# Patient Record
Sex: Female | Born: 1980 | Race: White | Hispanic: No | Marital: Married | Smoking: Never smoker
Health system: Southern US, Community
[De-identification: ages and names within clinical notes are randomized; demographics above are authoritative.]

## PROBLEM LIST (undated history)

## (undated) DIAGNOSIS — R569 Unspecified convulsions: Secondary | ICD-10-CM

## (undated) HISTORY — PX: WISDOM TOOTH EXTRACTION: SHX21

## (undated) HISTORY — DX: Unspecified convulsions: R56.9

---
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
## 2003-09-24 ENCOUNTER — Emergency Department (HOSPITAL_COMMUNITY): Admission: EM | Admit: 2003-09-24 | Discharge: 2003-09-25 | Payer: Self-pay | Admitting: Emergency Medicine

## 2013-07-03 ENCOUNTER — Other Ambulatory Visit (HOSPITAL_COMMUNITY): Payer: Self-pay | Admitting: *Deleted

## 2013-07-03 DIAGNOSIS — G40909 Epilepsy, unspecified, not intractable, without status epilepticus: Secondary | ICD-10-CM

## 2013-07-03 DIAGNOSIS — O99350 Diseases of the nervous system complicating pregnancy, unspecified trimester: Secondary | ICD-10-CM

## 2013-07-03 DIAGNOSIS — IMO0002 Reserved for concepts with insufficient information to code with codable children: Secondary | ICD-10-CM

## 2013-07-03 DIAGNOSIS — Z0489 Encounter for examination and observation for other specified reasons: Secondary | ICD-10-CM

## 2013-08-14 ENCOUNTER — Other Ambulatory Visit (HOSPITAL_COMMUNITY): Payer: Self-pay | Admitting: Advanced Practice Midwife

## 2013-08-14 DIAGNOSIS — G40909 Epilepsy, unspecified, not intractable, without status epilepticus: Secondary | ICD-10-CM

## 2013-08-14 DIAGNOSIS — O99350 Diseases of the nervous system complicating pregnancy, unspecified trimester: Secondary | ICD-10-CM

## 2013-08-14 DIAGNOSIS — IMO0002 Reserved for concepts with insufficient information to code with codable children: Secondary | ICD-10-CM

## 2013-08-14 DIAGNOSIS — Z0489 Encounter for examination and observation for other specified reasons: Secondary | ICD-10-CM

## 2013-08-15 ENCOUNTER — Encounter (HOSPITAL_COMMUNITY): Payer: Self-pay

## 2013-08-15 ENCOUNTER — Ambulatory Visit (HOSPITAL_COMMUNITY)
Admission: RE | Admit: 2013-08-15 | Discharge: 2013-08-15 | Disposition: A | Discharge status: Home / Self Care | Payer: 59 | Source: Ambulatory Visit | Attending: Advanced Practice Midwife | Admitting: Advanced Practice Midwife

## 2013-08-15 DIAGNOSIS — Z0489 Encounter for examination and observation for other specified reasons: Secondary | ICD-10-CM

## 2013-08-15 DIAGNOSIS — O99350 Diseases of the nervous system complicating pregnancy, unspecified trimester: Principal | ICD-10-CM

## 2013-08-15 DIAGNOSIS — Z363 Encounter for antenatal screening for malformations: Secondary | ICD-10-CM | POA: Insufficient documentation

## 2013-08-15 DIAGNOSIS — IMO0002 Reserved for concepts with insufficient information to code with codable children: Secondary | ICD-10-CM

## 2013-08-15 DIAGNOSIS — Z1389 Encounter for screening for other disorder: Secondary | ICD-10-CM | POA: Insufficient documentation

## 2013-08-15 DIAGNOSIS — G40909 Epilepsy, unspecified, not intractable, without status epilepticus: Secondary | ICD-10-CM | POA: Insufficient documentation

## 2013-08-15 DIAGNOSIS — Z87891 Personal history of nicotine dependence: Secondary | ICD-10-CM | POA: Insufficient documentation

## 2013-08-15 IMAGING — US US OB DETAIL+14 WK
1 series · 12 of 28 positions shown · non-contrast
Comparison: none

[Series 1: us ob detail+14 wk · 0.15mm/px · 12 of 79 slices shown]
[im 3/79]
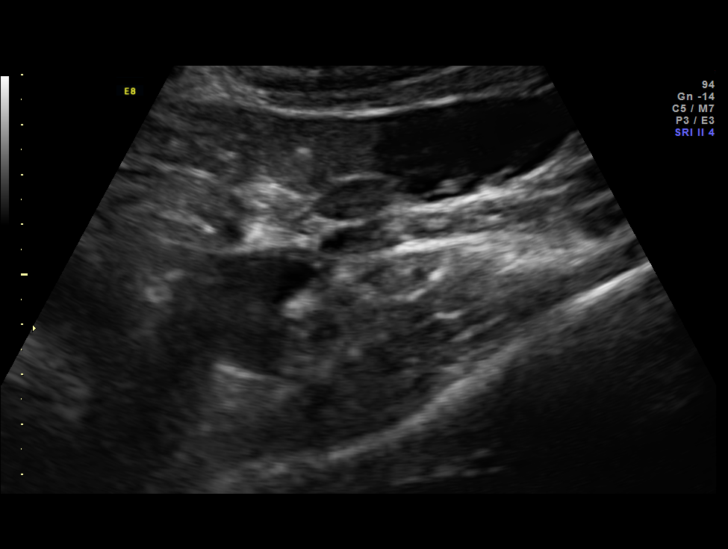
[im 9/79]
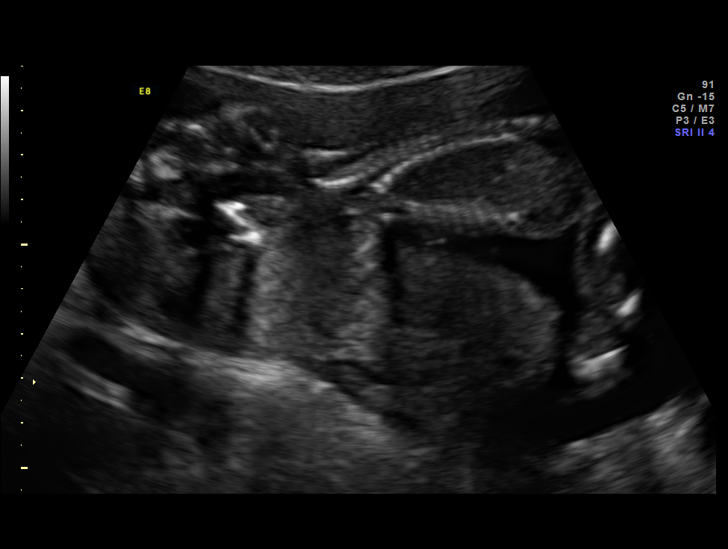
[im 15/79]
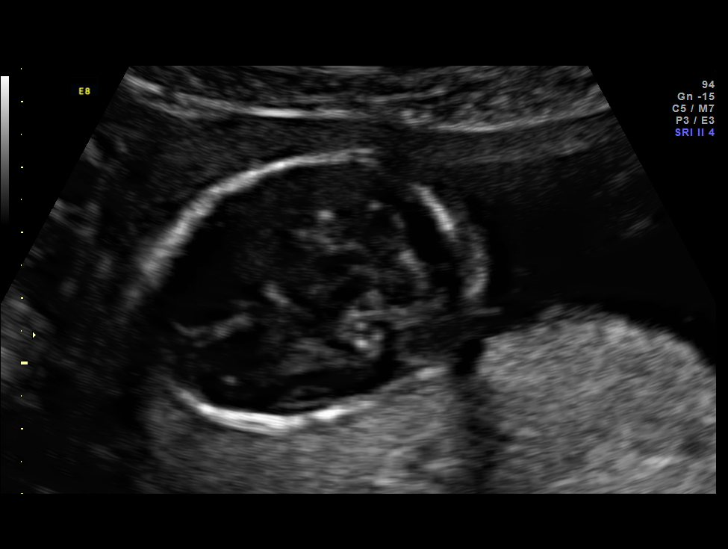
[im 24/79]
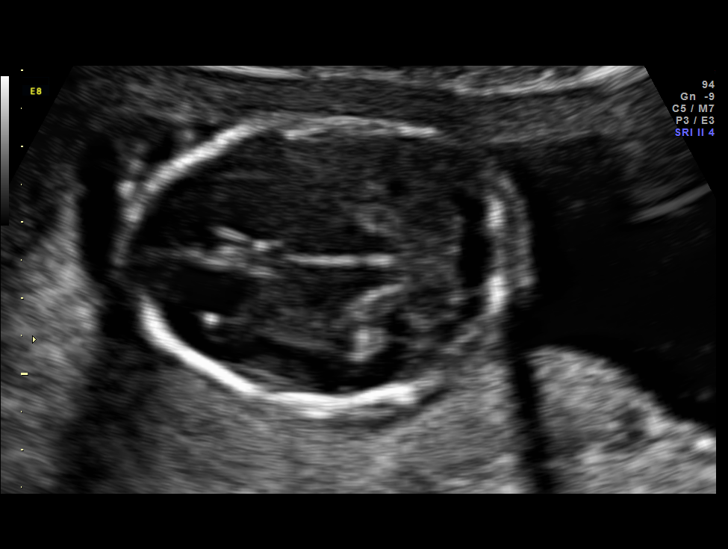
[im 29/79]
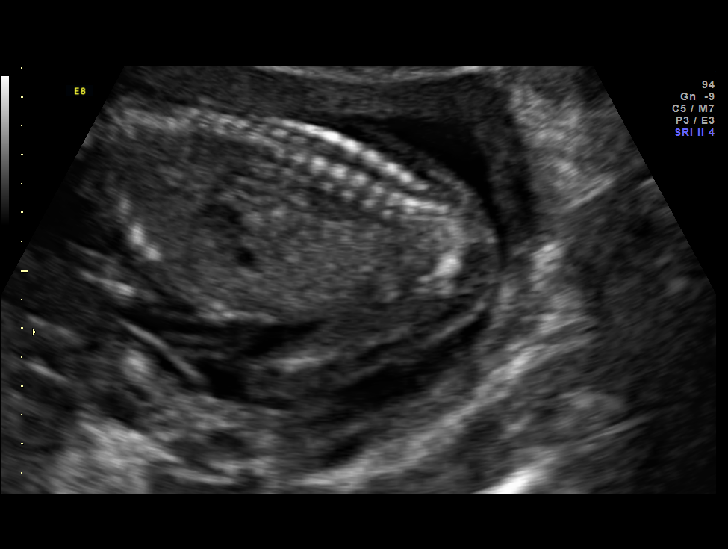
[im 35/79]
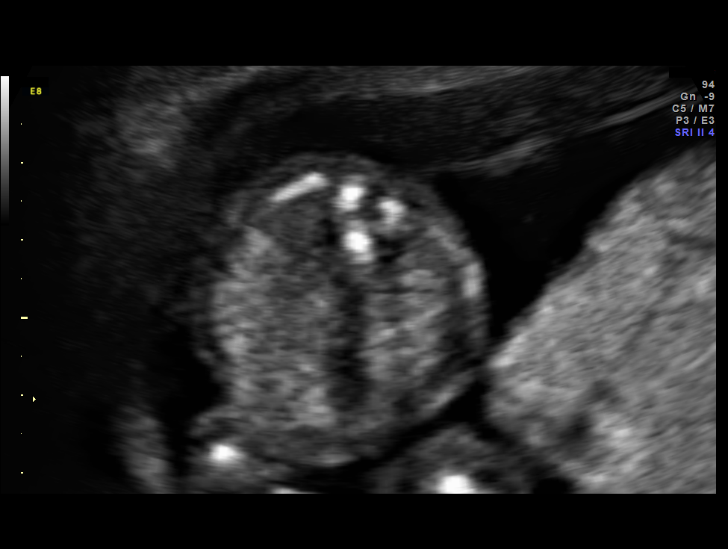
[im 44/79]
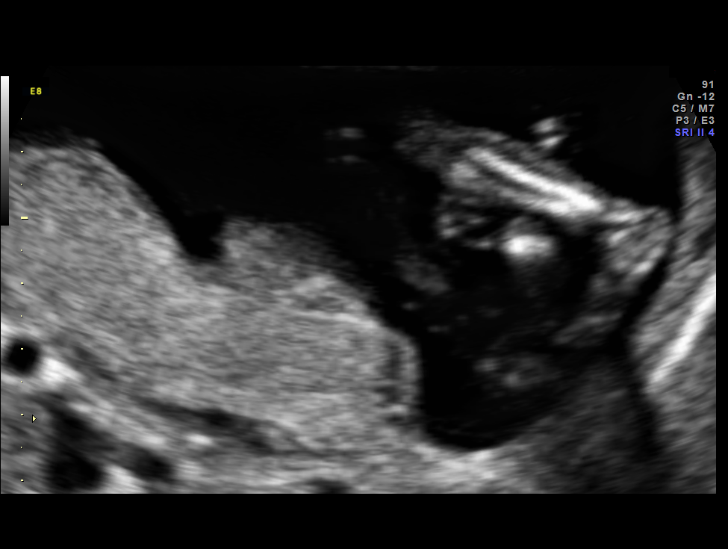
[im 50/79]
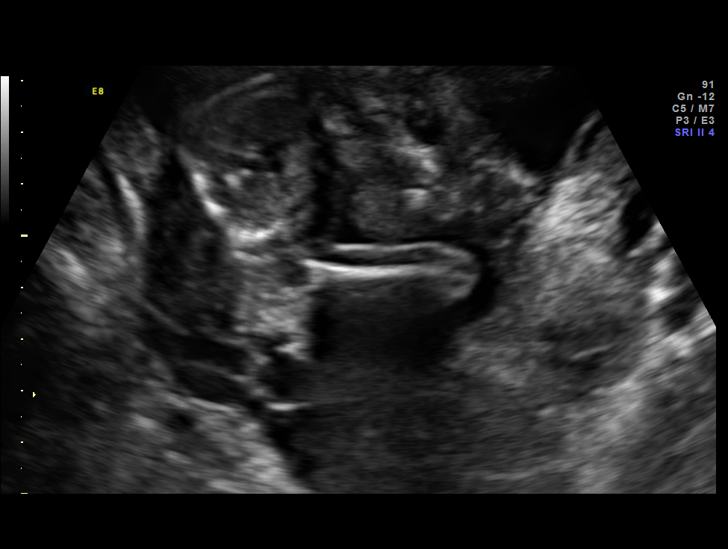
[im 55/79]
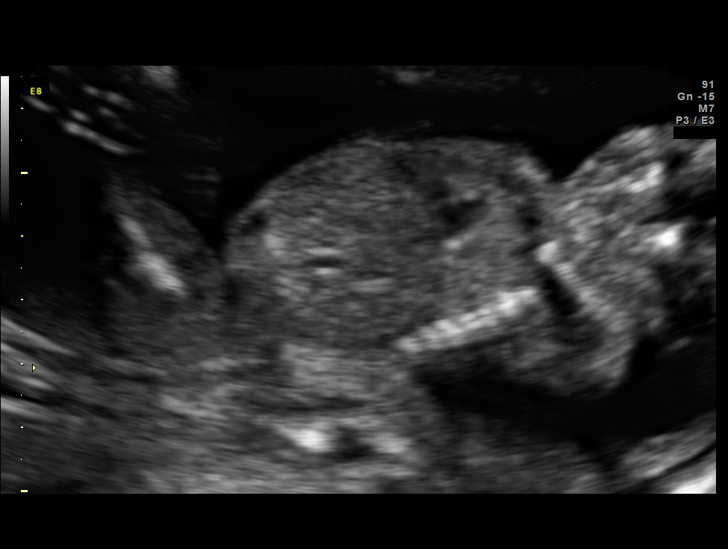
[im 64/79]
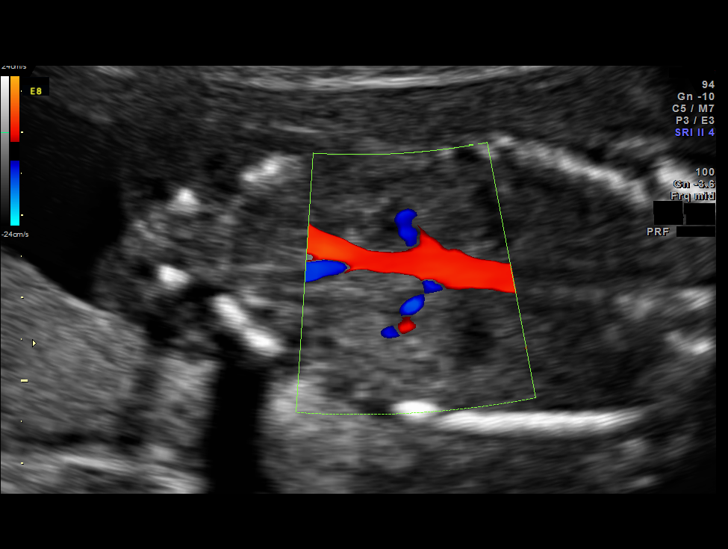
[im 70/79]
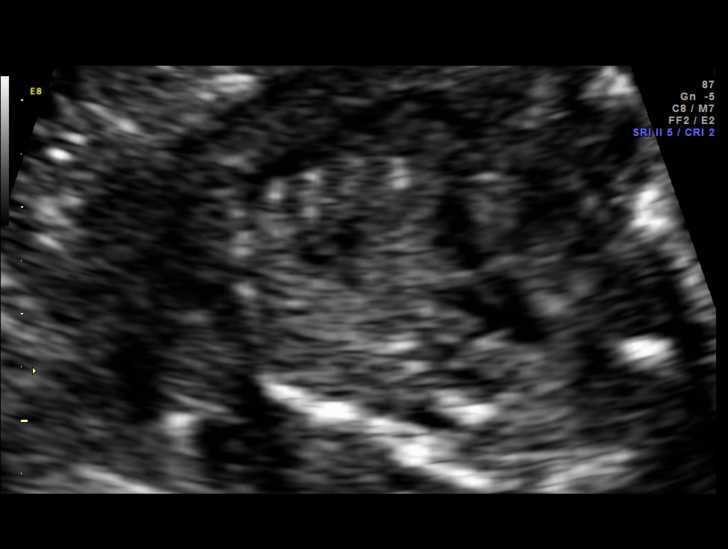
[im 76/79]
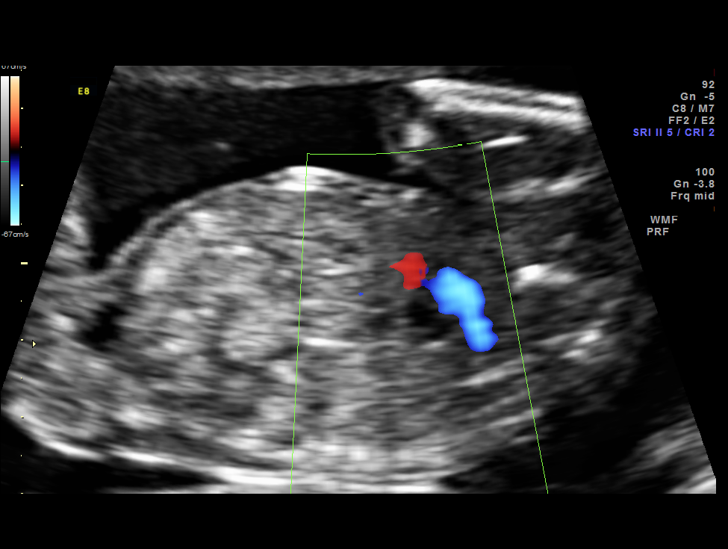

[12 of 28 positions shown; findings below may reference images not displayed]

OBSTETRICS REPORT
                      (Signed Final [DATE] [DATE])

Service(s) Provided

 US OB DETAIL + 14 WK                                  76811.0
Indications

 Detailed fetal anatomic survey                        655.83 [HM]
 Hypothyroid
 Seizure disorder-lamictal
Fetal Evaluation

 Num Of Fetuses:    1
 Fetal Heart Rate:  153                          bpm
 Cardiac Activity:  Observed
 Presentation:      Variable
 Placenta:          Posterior, above cervical
                    os
 P. Cord            Visualized
 Insertion:

 Amniotic Fluid
 AFI FV:      Subjectively within normal limits
                                             Larg Pckt:     6.4  cm
Biometry

 BPD:     36.6  mm     G. Age:  17w 1d                CI:         67.3   70 - 86
 OFD:     54.4  mm                                    FL/HC:      18.2   14.6 -

 HC:     148.9  mm     G. Age:  18w 0d       81  %    HC/AC:      1.18   1.07 -

 AC:     125.9  mm     G. Age:  18w 1d       81  %    FL/BPD:
 FL:      27.1  mm     G. Age:  18w 2d       83  %    FL/AC:      21.5   20 - 24
 HUM:     24.8  mm     G. Age:  17w 6d       72  %
 CER:     17.9  mm     G. Age:  17w 6d       66  %
 NFT:      2.7  mm

 Est. FW:     227  gm      0 lb 8 oz   > 75  %
Gestational Age

 LMP:           17w 1d        Date:  [DATE]                 EDD:   [DATE]
 U/S Today:     17w 6d                                        EDD:   [DATE]
 Best:          17w 1d     Det. By:  LMP  ([DATE])          EDD:   [DATE]
Anatomy

 Cranium:          Appears normal         Aortic Arch:      Appears normal
 Fetal Cavum:      Appears normal         Ductal Arch:      Appears normal
 Ventricles:       Appears normal         Diaphragm:        Appears normal
 Choroid Plexus:   Appears normal         Stomach:          Appears normal, left
                                                            sided
 Cerebellum:       Appears normal         Abdomen:          Appears normal
 Posterior Fossa:  Appears normal         Abdominal Wall:   Appears nml (cord
                                                            insert, abd wall)
 Nuchal Fold:      Appears normal         Cord Vessels:     Appears normal (3
                                                            vessel cord)
 Face:             Appears normal         Kidneys:          Appear normal
                   (orbits and profile)
 Lips:             Not well visualized    Bladder:          Appears normal
 Heart:            Appears normal         Spine:            Appears normal
                   (4CH, axis, and
                   situs)
 RVOT:             Appears normal         Lower             Appears normal
                                          Extremities:
 LVOT:             Appears normal         Upper             Appears normal
                                          Extremities:

 Other:  Parents do not wish to know sex of fetus. Heels appears normal.
Targeted Anatomy

 Fetal Central Nervous System
 Cisterna Magna:
Cervix Uterus Adnexa

 Cervical Length:    3.4      cm

 Cervix:       Normal appearance by transabdominal scan. Appears
               closed, without funnelling.

 Left Ovary:    Not visualized. No adnexal mass visualized.
 Right Ovary:   Within normal limits.
Comments

 The patient's fetal anatomic survey is not complete due to
 fetal position.  All anatomy was visualized except for the fetal
 lips.  As Ms. NAZARETH has a seizure disorder and is on
 lamotrigine, it is important to rule out a cleft lip.  I recommend
 a repeat ultrasound in two weeks to re-evaluate the fetal lips.
 However, this does not need to be done in the [facility].  It can
 be done in her primary OB's office if desired.
Impression

 Single living intrauterine pregnancy at 17 weeks 1 day.
 Appropriate fetal growth.
 Normal amniotic fluid volume.
 No gross fetal anomalies identified.
Recommendations

 Recommend follow-up ultrasound examination in 2 weeks
 (see comment).
 questions or concerns.
                NAZARETH

## 2013-08-15 NOTE — Progress Notes (Signed)
MATERNAL FETAL MEDICINE CONSULT  Patient Name: Elizabeth Nielsen Medical Record Number:  [id number] Date of Birth: [birth year] Requesting Physician Name:  Hale Bogus, CNM Date of Service: 08/15/2013  Chief Complaint Seizure disorder in pregnancy  History of Present Illness Elizabeth Nielsen was seen today for prenatal diagnosis secondary to a seizure disorder in pregnancy at the request of Hale Bogus, CNM.  The patient is a 33 y.o. G3P2002,at [redacted]w[redacted]d with an EDD of 01/22/2014, by Last Menstrual Period dating method.  She was diagnosed with complex partial seizures approximately 3 years ago and was placed on lamotrigine approximately 1 year ago (since her last delivery).  Her seizures are under control while taking the medication.  However, she does report some seizures in the first trimester when she forgot to take the lamotrigine for a few days.  She is otherwise healthy and has had no complications of this pregnancy.  Review of Systems Pertinent items are noted in HPI.  Patient History OB History  Gravida Para Term Preterm AB SAB TAB Ectopic Multiple Living  3 2 2  0 0 0 0 0 0 2    # Outcome Date GA Lbr Len/2nd Weight Sex Delivery Anes PTL Lv  3 CUR           2 TRM           1 TRM               No past medical history on file.  No past surgical history on file.  History   Social History  . Marital Status: Single    Spouse Name: N/A    Number of Children: N/A  . Years of Education: N/A   Social History Main Topics  . Smoking status: Not on file  . Smokeless tobacco: Not on file  . Alcohol Use: Not on file  . Drug Use: Not on file  . Sexual Activity: Not on file   Other Topics Concern  . Not on file   Social History Narrative  . No narrative on file    Family History The patient has no family history of mental retardation, birth defects, or genetic diseases.  Physical Examination Vitals - BP 126/69, Pulse 67, Weight 156 lbs. General appearance - alert,  well appearing, and in no distress Abdomen - soft, nontender, nondistended, no masses or organomegaly Extremities - no pedal edema noted  Assessment and Recommendations 1.  Seizure Disorder.  Elizabeth Nielsen seizure disorder is currently well controlled on lamotrigine.  Although there is a concern for congenital anomalies, particularly neural tube defects and cleft lip/palate, in women taking lamotrigine, I recommend continuing it throughout pregnancy as the benefits outweigh the risk of uncontrolled seizures that may result if it is discontinued.  Today's ultrasound clearly ruled out a neural tube defect, but the fetal lip could not be adequately visualized.  Thus, a follow up ultrasound in approximately 2 weeks is recommended to re-evaluate the fetal lip.  That ultrasound does not need to be performed in the [facility], but can instead be performed in your office is you wish.  If not, we are happy to see Elizabeth Nielsen back for another ultrasound, just call to arrange the appointment.  It is common for the serum therapeutic levels of lamotrigine to decrease in pregnancy as the volume of distribution expands.  Thus, she should have frequent appointments with Neurology and have serum levels assessed at least every trimester, or more frequently if she has breakthrough seizures.  I spent 30 minutes with Elizabeth Nielsen today of which 50% was face-to-face counseling.  [facility].  Please do not hesitate to contact us with questions.   Rema FendtJoshua Naria Abbey, MD

## 2014-01-26 ENCOUNTER — Encounter (HOSPITAL_COMMUNITY): Payer: Self-pay

## 2014-06-20 ENCOUNTER — Encounter (HOSPITAL_COMMUNITY): Payer: Self-pay | Admitting: *Deleted

## 2016-01-20 ENCOUNTER — Telehealth: Payer: Self-pay | Admitting: Family Medicine

## 2016-01-21 NOTE — Telephone Encounter (Signed)
Pt and both of her children given appts 02/01/16 with Dr.Stacks.

## 2016-02-01 ENCOUNTER — Encounter: Payer: Self-pay | Admitting: Family Medicine

## 2016-02-01 ENCOUNTER — Encounter (INDEPENDENT_AMBULATORY_CARE_PROVIDER_SITE_OTHER): Payer: Self-pay

## 2016-02-01 ENCOUNTER — Ambulatory Visit (INDEPENDENT_AMBULATORY_CARE_PROVIDER_SITE_OTHER): Payer: 59 | Admitting: Family Medicine

## 2016-02-01 VITALS — BP 112/69 | HR 77 | Temp 98.0°F | Ht 67.0 in | Wt 145.0 lb

## 2016-02-01 DIAGNOSIS — R079 Chest pain, unspecified: Secondary | ICD-10-CM

## 2016-02-01 NOTE — Progress Notes (Addendum)
Subjective:  Patient ID: Elizabeth MccallumSarah L Nielsen, female    DOB: 1980/04/29  Age: 35 y.o. MRN: [id number]  CC: Establish Care (New pt )   HPI Elizabeth Nielsen presents for Intermittent chest pain. Pain is precordial. It does not radiate. It is not clearly related to exertion. There is some exertional component however. There is no dyspnea diaphoresis or nausea.   History Elizabeth Nielsen has a past medical history of Seizures (HCC).   She has a past surgical history that includes Wisdom tooth extraction.   Her family history includes Cancer in her father and mother.She reports that she has never smoked. She has never used smokeless tobacco. Her alcohol and drug histories are not on file.    ROS Review of Systems  Constitutional: Negative for appetite change, chills, diaphoresis, fatigue, fever and unexpected weight change.  HENT: Negative for congestion, ear pain, hearing loss, postnasal drip, rhinorrhea, sneezing, sore throat and trouble swallowing.   Eyes: Negative for pain.  Respiratory: Negative for cough, chest tightness and shortness of breath.   Cardiovascular: Positive for chest pain. Negative for palpitations.  Gastrointestinal: Negative for abdominal pain, constipation, diarrhea, nausea and vomiting.  Endocrine: Negative for cold intolerance, heat intolerance, polydipsia, polyphagia and polyuria.  Genitourinary: Negative for dysuria, frequency and menstrual problem.  Musculoskeletal: Negative for arthralgias and joint swelling.  Skin: Negative for rash.  Allergic/Immunologic: Negative for environmental allergies.  Neurological: Negative for dizziness, weakness, numbness and headaches.  Psychiatric/Behavioral: Negative for agitation and dysphoric mood.    Objective:  BP 112/69 (BP Location: Left Arm)   Pulse 77   Temp 98 F (36.7 C) (Oral)   Ht 5\' 7"  (1.702 m)   Wt 145 lb (65.8 kg)   LMP 01/21/2016   BMI 22.71 kg/m   BP Readings from Last 3 Encounters:  02/01/16 112/69  08/15/13  126/69    Wt Readings from Last 3 Encounters:  02/01/16 145 lb (65.8 kg)  08/15/13 153 lb 4 oz (69.5 kg)     Physical Exam  Constitutional: She is oriented to person, place, and time. She appears well-developed and well-nourished. No distress.  HENT:  Head: Normocephalic and atraumatic.  Right Ear: External ear normal.  Left Ear: External ear normal.  Nose: Nose normal.  Mouth/Throat: Oropharynx is clear and moist.  Eyes: Conjunctivae and EOM are normal. Pupils are equal, round, and reactive to light.  Neck: Normal range of motion. Neck supple. No thyromegaly present.  Cardiovascular: Normal rate and regular rhythm.   Murmur heard.  Systolic murmur is present  Pulmonary/Chest: Effort normal and breath sounds normal. No respiratory distress. She has no wheezes. She has no rales.  Abdominal: Soft. Bowel sounds are normal. She exhibits no distension. There is no tenderness.  Lymphadenopathy:    She has no cervical adenopathy.  Neurological: She is alert and oriented to person, place, and time. She has normal reflexes.  Skin: Skin is warm and dry.  Psychiatric: She has a normal mood and affect. Her behavior is normal. Judgment and thought content normal.     No results found for: WBC, HGB, HCT, PLT, GLUCOSE, CHOL, TRIG, HDL, LDLDIRECT, LDLCALC, ALT, AST, NA, K, CL, CREATININE, BUN, CO2, TSH, PSA, INR, GLUF, HGBA1C, MICROALBUR  [place] Ob Detail + 14 Wk  Result Date: 08/15/2013 OBSTETRICAL ULTRASOUND: This exam was performed within a [facility]. The OB US report was generated in the [facility] system, and faxed to the ordering physician.  This report is also available in [facility] [employer] and in the [facility] PACS. See [facility] Obstetric US report.   Assessment & Plan:   Elizabeth Nielsen was seen today for establish care.  Diagnoses and all orders for this visit:  Chest pain, unspecified type -     EKG 12-Lead -     ECHOCARDIOGRAM STRESS TEST; Future    I have  discontinued Elizabeth Nielsen's levothyroxine. I am also having her maintain her lamoTRIgine.  No orders of the defined types were placed in this encounter.    Follow-up: Return in about 1 month (around 03/02/2016).  Mechele ClaudeWarren Pablo Mathurin, M.D.

## 2016-02-09 ENCOUNTER — Ambulatory Visit: Payer: Self-pay | Admitting: Family Medicine

## 2016-02-20 DIAGNOSIS — R079 Chest pain, unspecified: Secondary | ICD-10-CM | POA: Insufficient documentation

## 2018-09-10 ENCOUNTER — Ambulatory Visit: Payer: 59 | Admitting: Family Medicine

## 2018-12-17 ENCOUNTER — Telehealth: Payer: Self-pay | Admitting: Nurse Practitioner

## 2018-12-17 ENCOUNTER — Encounter: Payer: Self-pay | Admitting: Family Medicine

## 2018-12-17 NOTE — Progress Notes (Signed)
Contacted  Via phone. She had and adverse reaction to tdap vaccine several months ago. She says she had a rash and body aches, which they said came from tdap. Now she needs another vaccination and she is afraid will happen again. Marland Kitchenneeds telephone visit with PCP Discussed with patient

## 2018-12-18 ENCOUNTER — Encounter: Payer: Self-pay | Admitting: *Deleted

## 2018-12-19 ENCOUNTER — Encounter: Payer: Self-pay | Admitting: Family

## 2018-12-19 ENCOUNTER — Ambulatory Visit (INDEPENDENT_AMBULATORY_CARE_PROVIDER_SITE_OTHER): Payer: Self-pay | Admitting: Family

## 2018-12-19 DIAGNOSIS — T50B95A Adverse effect of other viral vaccines, initial encounter: Secondary | ICD-10-CM

## 2018-12-19 NOTE — Progress Notes (Signed)
   Virtual Visit via telephone Note Due to COVID-19 pandemic this visit was conducted virtually. This visit type was conducted due to national recommendations for restrictions regarding the COVID-19 Pandemic (e.g. social distancing, sheltering in place) in an effort to limit this patient's exposure and mitigate transmission in our community. All issues noted in this document were discussed and addressed.  A physical exam was not performed with this format.  I connected with Elizabeth Nielsen on 12/19/18 at 11:40 AM by telephone and verified that I am speaking with the correct person using two identifiers. Elizabeth Nielsen is currently located at home and no one is currently with her during visit. The provider, Evelina Dun, FNP is located in their office at time of visit.  I discussed the limitations, risks, security and privacy concerns of performing an evaluation and management service by telephone and the availability of in person appointments. I also discussed with the patient that there may be a patient responsible charge related to this service. The patient expressed understanding and agreed to proceed.   History and Present Illness:  HPI  Pt calls the office today with several questions. She states she has a history of seizures and  States when she was pregnant and got the Flu vaccine she got a reaction that was similar to how she felt unmediated with her seizures.  She states she got the TDAP in 07/2018  rash on her cheeks, and had vision changes.   She states her work is requiring the Influenza vaccine and hepatitis B vaccine. She states she is nervous with her history.    Review of Systems  All other systems reviewed and are negative.    Observations/Objective: No SOB or distress noted   Assessment and Plan: 1. Adverse effect of influenza vaccine, initial encounter Will write note for patient to hold off on flu vaccine this year. She will wear mask all flu season. Keep Neurologists  follow up.        I discussed the assessment and treatment plan with the patient. The patient was provided an opportunity to ask questions and all were answered. The patient agreed with the plan and demonstrated an understanding of the instructions.   The patient was advised to call back or seek an in-person evaluation if the symptoms worsen or if the condition fails to improve as anticipated.  The above assessment and management plan was discussed with the patient. The patient verbalized understanding of and has agreed to the management plan. Patient is aware to call the clinic if symptoms persist or worsen. Patient is aware when to return to the clinic for a follow-up visit. Patient educated on when it is appropriate to go to the emergency department.   Time call ended:  12: 00 pm  I provided 20 minutes of non-face-to-face time during this encounter.    Evelina Dun, FNP

## 2019-01-07 ENCOUNTER — Encounter: Payer: Self-pay | Admitting: Family Medicine

## 2019-05-05 ENCOUNTER — Other Ambulatory Visit: Payer: Self-pay

## 2019-05-06 ENCOUNTER — Ambulatory Visit: Payer: Self-pay | Admitting: Family Medicine

## 2019-11-19 ENCOUNTER — Telehealth: Payer: Self-pay | Admitting: Family Medicine

## 2019-11-19 NOTE — Telephone Encounter (Signed)
error 

## 2020-03-18 ENCOUNTER — Encounter: Payer: Self-pay | Admitting: Family Medicine

## 2020-03-25 ENCOUNTER — Encounter: Payer: Self-pay | Admitting: Family Medicine

## 2022-01-27 ENCOUNTER — Encounter: Payer: Self-pay | Admitting: Family Medicine

## 2022-02-02 ENCOUNTER — Encounter: Payer: Self-pay | Admitting: Family Medicine
